# Patient Record
Sex: Female | Born: 1990 | Race: White | Hispanic: No | Marital: Single | Smoking: Never smoker
Health system: Southern US, Community
[De-identification: ages and names within clinical notes are randomized; demographics above are authoritative.]

---
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
## 2010-12-06 ENCOUNTER — Encounter: Payer: Self-pay | Admitting: Obstetrics and Gynecology

## 2014-02-01 ENCOUNTER — Encounter (HOSPITAL_COMMUNITY): Admission: RE | Payer: Self-pay | Source: Ambulatory Visit

## 2014-02-01 ENCOUNTER — Ambulatory Visit (HOSPITAL_COMMUNITY)
Admission: RE | Admit: 2014-02-01 | Payer: Managed Care, Other (non HMO) | Source: Ambulatory Visit | Admitting: Obstetrics and Gynecology

## 2014-02-01 SURGERY — DILATATION AND CURETTAGE /HYSTEROSCOPY
Anesthesia: Choice

## 2017-04-23 ENCOUNTER — Emergency Department (HOSPITAL_COMMUNITY)
Admission: EM | Admit: 2017-04-23 | Discharge: 2017-04-23 | Disposition: A | Discharge status: Home / Self Care | Payer: Managed Care, Other (non HMO) | Attending: Emergency Medicine | Admitting: Emergency Medicine

## 2017-04-23 DIAGNOSIS — F10120 Alcohol abuse with intoxication, uncomplicated: Secondary | ICD-10-CM | POA: Insufficient documentation

## 2017-04-23 DIAGNOSIS — F10920 Alcohol use, unspecified with intoxication, uncomplicated: Secondary | ICD-10-CM

## 2017-04-23 LAB — COMPREHENSIVE METABOLIC PANEL
ALK PHOS: 71 U/L (ref 38–126)
ALT: 17 U/L (ref 14–54)
ANION GAP: 11 (ref 5–15)
AST: 33 U/L (ref 15–41)
Albumin: 4.7 g/dL (ref 3.5–5.0)
BILIRUBIN TOTAL: 1 mg/dL (ref 0.3–1.2)
BUN: 6 mg/dL (ref 6–20)
CALCIUM: 8.9 mg/dL (ref 8.9–10.3)
CO2: 23 mmol/L (ref 22–32)
Chloride: 103 mmol/L (ref 101–111)
Creatinine, Ser: 0.69 mg/dL (ref 0.44–1.00)
GFR calc Af Amer: >60 mL/min (ref >60)
GLUCOSE: 115 mg/dL — AB (ref 65–99)
POTASSIUM: 4.6 mmol/L (ref 3.5–5.1)
Sodium: 137 mmol/L (ref 135–145)
TOTAL PROTEIN: 7.7 g/dL (ref 6.5–8.1)

## 2017-04-23 LAB — CBC WITH DIFFERENTIAL/PLATELET
Basophils Absolute: 0 10*3/uL (ref 0.0–0.1)
Basophils Relative: 0 %
Eosinophils Absolute: 0 10*3/uL (ref 0.0–0.7)
Eosinophils Relative: 0 %
HEMATOCRIT: 41 % (ref 36.0–46.0)
HEMOGLOBIN: 14.7 g/dL (ref 12.0–15.0)
LYMPHS ABS: 3.8 10*3/uL (ref 0.7–4.0)
LYMPHS PCT: 43 %
MCH: 31.6 pg (ref 26.0–34.0)
MCHC: 35.9 g/dL (ref 30.0–36.0)
MCV: 88.2 fL (ref 78.0–100.0)
MONO ABS: 0.4 10*3/uL (ref 0.1–1.0)
MONOS PCT: 5 %
NEUTROS ABS: 4.5 10*3/uL (ref 1.7–7.7)
NEUTROS PCT: 52 %
Platelets: 282 10*3/uL (ref 150–400)
RBC: 4.65 MIL/uL (ref 3.87–5.11)
RDW: 11.5 % (ref 11.5–15.5)
WBC: 8.7 10*3/uL (ref 4.0–10.5)

## 2017-04-23 LAB — ETHANOL: ALCOHOL ETHYL (B): 328 mg/dL — AB (ref <5)

## 2017-04-23 LAB — I-STAT BETA HCG BLOOD, ED (MC, WL, AP ONLY): I-stat hCG, quantitative: <5 m[IU]/mL (ref <5)

## 2017-04-23 MED ORDER — ONDANSETRON 8 MG PO TBDP: 8.0000 mg | ORAL_TABLET | Freq: Three times a day (TID) | ORAL | 0 refills | Status: AC | PRN

## 2017-04-23 NOTE — ED Notes (Signed)
Pt is too intoxicated to give information about medical/surgical hx, allergies or safety screenings at this time.

## 2017-04-23 NOTE — ED Notes (Signed)
Assisted patient getting dressed. Gave patient paper scrub top to wear home.

## 2017-04-23 NOTE — ED Provider Notes (Signed)
9:44 AM Pt is awake. Her father is here and will take her home. Vitals good.    Gwendolyn Ochoa, Gwendolyn Rowlands, MD 04/23/17 [phone]

## 2017-04-23 NOTE — ED Notes (Signed)
Father given patient belonging back and informed him that I was told by night shift that she had lot of money in her wallet.

## 2017-04-23 NOTE — ED Triage Notes (Signed)
Pt bib [facility] d/t ETOH intoxication.  Per EMS pt had been out drinking w/ friends when she fell asleep on the bar.  Pt responds to painful stimuli only.  VSS in the field.

## 2017-04-23 NOTE — ED Notes (Signed)
Date and time results received: 04/23/17 0620 (use smartphrase ".now" to insert current time)  Test: ETOH Critical Value: 328  Name of Provider Notified: Preston FleetingGlick  Orders Received? Or Actions Taken?: none

## 2017-04-23 NOTE — ED Provider Notes (Signed)
[facility]-EMERGENCY DEPT Provider Note   CSN: [id number] Arrival date & time: 04/23/17  0445     History   Chief Complaint Chief Complaint  Patient presents with  . Alcohol Intoxication    HPI Gwendolyn Ochoa is a 26 y.o. female.  The history is provided by the EMS personnel. The history is limited by the condition of the patient (Intoxicated).  She was brought in by ambulance after being found passed out at a sushi bar. She is not able to give any history.  No past medical history on file.  There are no active problems to display for this patient.   No past surgical history on file.  OB History    No data available       Home Medications    Prior to Admission medications   Not on File    Family History No family history on file.  Social History Social History  Substance Use Topics  . Smoking status: Not on file  . Smokeless tobacco: Not on file  . Alcohol use Not on file     Allergies   Patient has no allergy information on record.   Review of Systems Review of Systems  Unable to perform ROS: Mental status change     Physical Exam Updated Vital Signs BP 123/84   Pulse 72   Resp 17   SpO2 98%   Physical Exam  Nursing note and vitals reviewed.  26 year old female, resting comfortably and in no acute distress. Vital signs are normal. Oxygen saturation is 98%, which is normal. Head is normocephalic and atraumatic. PERRLA, EOMI. Oropharynx is clear. Neck is nontender and supple without adenopathy or JVD. Back is nontender and there is no CVA tenderness. Lungs are clear without rales, wheezes, or rhonchi. Chest is nontender. Heart has regular rate and rhythm without murmur. Abdomen is soft, flat, nontender without masses or hepatosplenomegaly and peristalsis is normoactive. Extremities have no cyanosis or edema, full range of motion is present. Skin is warm and dry without rash. Neurologic: She is somnolent but arousable, oriented to person but not  place or time, speech is somewhat slurred consistent with alcohol intoxication. Cranial nerves are grossly intact. She moves all extremities equally.  ED Treatments / Results  Labs (all labs ordered are listed, but only abnormal results are displayed) Labs Reviewed  COMPREHENSIVE METABOLIC PANEL - Abnormal; Notable for the following:       Result Value   Glucose, Bld 115 (*)    All other components within normal limits  ETHANOL - Abnormal; Notable for the following:    Alcohol, Ethyl (B) 328 (*)    All other components within normal limits  CBC WITH DIFFERENTIAL/PLATELET  I-STAT BETA HCG BLOOD, ED ([facility], [facility], [facility] ONLY)    Procedures Procedures (including critical care time)  Medications Ordered in ED Medications - No data to display   Initial Impression / Assessment and Plan / ED Course  I have reviewed the triage vital signs and the nursing notes.  Pertinent labs & imaging results that were available during my care of the patient were reviewed by me and considered in my medical decision making (see chart for details).  Apparent alcohol intoxication. Old records are reviewed, and she has no relevant past visits. Screening labs and alcohol level will be checked and patient will be observed.  Ethanol level is come back very high at 328. This is sufficiently high to account for the patient's presentation. She will need to be  observed in the ED until she is able to ambulate and converse.  Final Clinical Impressions(s) / ED Diagnoses   Final diagnoses:  Alcohol intoxication, uncomplicated (HCC)    New Prescriptions New Prescriptions   No medications on file     Dione Booze, MD 04/23/17 [phone]

## 2017-04-23 NOTE — ED Notes (Signed)
Patient's father called stating that he just found out patient was here in ED and he is on his way to come get her.

## 2017-04-23 NOTE — ED Notes (Signed)
Bed: RESA Expected date:  Expected time:  Means of arrival:  Comments: 26yo ETOH/UNRESPONSIVE

## 2017-04-23 NOTE — ED Notes (Signed)
Patient has one belonging bag that is locked in lower cabinet in treatment room #17.

## 2019-09-11 ENCOUNTER — Other Ambulatory Visit: Payer: Self-pay

## 2019-09-11 DIAGNOSIS — Z20822 Contact with and (suspected) exposure to covid-19: Secondary | ICD-10-CM

## 2019-09-12 LAB — NOVEL CORONAVIRUS, NAA: SARS-CoV-2, NAA: NOT DETECTED

## 2019-10-03 ENCOUNTER — Other Ambulatory Visit: Payer: Self-pay

## 2019-10-03 DIAGNOSIS — Z20822 Contact with and (suspected) exposure to covid-19: Secondary | ICD-10-CM

## 2019-10-05 LAB — NOVEL CORONAVIRUS, NAA: SARS-CoV-2, NAA: NOT DETECTED

## 2019-11-14 ENCOUNTER — Ambulatory Visit: Payer: BLUE CROSS/BLUE SHIELD | Attending: Internal Medicine

## 2019-11-14 DIAGNOSIS — Z20822 Contact with and (suspected) exposure to covid-19: Secondary | ICD-10-CM

## 2019-11-16 LAB — NOVEL CORONAVIRUS, NAA: SARS-CoV-2, NAA: NOT DETECTED

## 2019-11-22 ENCOUNTER — Ambulatory Visit: Payer: BLUE CROSS/BLUE SHIELD | Attending: Internal Medicine

## 2019-11-22 DIAGNOSIS — Z20822 Contact with and (suspected) exposure to covid-19: Secondary | ICD-10-CM

## 2019-11-24 LAB — NOVEL CORONAVIRUS, NAA: SARS-CoV-2, NAA: NOT DETECTED

## 2020-07-08 ENCOUNTER — Other Ambulatory Visit: Payer: BLUE CROSS/BLUE SHIELD

## 2020-07-08 ENCOUNTER — Other Ambulatory Visit: Payer: Self-pay

## 2020-07-08 DIAGNOSIS — Z20822 Contact with and (suspected) exposure to covid-19: Secondary | ICD-10-CM

## 2020-07-10 LAB — NOVEL CORONAVIRUS, NAA: SARS-CoV-2, NAA: NOT DETECTED

## 2020-07-10 LAB — SARS-COV-2, NAA 2 DAY TAT

## 2021-10-17 ENCOUNTER — Other Ambulatory Visit: Payer: Self-pay

## 2021-10-17 ENCOUNTER — Emergency Department (HOSPITAL_COMMUNITY): Payer: BLUE CROSS/BLUE SHIELD

## 2021-10-17 ENCOUNTER — Encounter (HOSPITAL_COMMUNITY): Payer: Self-pay

## 2021-10-17 ENCOUNTER — Emergency Department (HOSPITAL_COMMUNITY)
Admission: EM | Admit: 2021-10-17 | Discharge: 2021-10-18 | Disposition: A | Discharge status: Home / Self Care | Payer: BLUE CROSS/BLUE SHIELD | Attending: Emergency Medicine | Admitting: Emergency Medicine

## 2021-10-17 DIAGNOSIS — W540XXA Bitten by dog, initial encounter: Secondary | ICD-10-CM | POA: Insufficient documentation

## 2021-10-17 DIAGNOSIS — S31821A Laceration without foreign body of left buttock, initial encounter: Secondary | ICD-10-CM | POA: Insufficient documentation

## 2021-10-17 DIAGNOSIS — S50811A Abrasion of right forearm, initial encounter: Secondary | ICD-10-CM | POA: Diagnosis not present

## 2021-10-17 MED ORDER — AMOXICILLIN-POT CLAVULANATE 875-125 MG PO TABS: 1.0000 | ORAL_TABLET | Freq: Two times a day (BID) | ORAL | 0 refills | Status: AC

## 2021-10-17 MED ORDER — HYDROCODONE-ACETAMINOPHEN 5-325 MG PO TABS
1.0000 | ORAL_TABLET | Freq: Once | ORAL | Status: AC
  Administered 2021-10-18: 1 via ORAL
  Filled 2021-10-17: qty 1

## 2021-10-17 NOTE — ED Provider Notes (Signed)
[facility] DEPT Provider Note   CSN: [id number] Arrival date & time: 10/17/21  2239     History Chief Complaint  Patient presents with   Animal Bite   Extremity Laceration    Gwendolyn Ochoa is a 30 y.o. female.   Animal Bite Associated symptoms: no fever and no rash    30 year old female presenting to the emergency department with multiple wounds from a dog bite.  The patient states that she was trying to separate her dog from her boyfriend's dog.  She states that her tetanus is up-to-date as of the past 2 years.  She states that the dog's shots are also up-to-date.  She sustained a small 0.5 cm laceration to the left buttocks as well as abrasions to the right forearm.  She endorses pain that is sharp, shooting associated swelling in her right wrist and forearm, worse with movement, better with rest.  History reviewed. No pertinent past medical history.  There are no problems to display for this patient.   History reviewed. No pertinent surgical history.   OB History   No obstetric history on file.     History reviewed. No pertinent family history.  Social History   Tobacco Use   Smoking status: Never   Smokeless tobacco: Never  Substance Use Topics   Alcohol use: Yes    Comment: occasionally   Drug use: Never    Home Medications Prior to Admission medications   Medication Sig Start Date End Date Taking? Authorizing Provider  amoxicillin-clavulanate (AUGMENTIN) 875-125 MG tablet Take 1 tablet by mouth every 12 (twelve) hours. 10/17/21  Yes Regan Lemming, MD  HYDROcodone-acetaminophen (NORCO/VICODIN) 5-325 MG tablet Take 2 tablets by mouth every 4 (four) hours as needed. 10/18/21  Yes Regan Lemming, MD  ondansetron (ZOFRAN ODT) 8 MG disintegrating tablet Take 1 tablet (8 mg total) by mouth every 8 (eight) hours as needed for nausea or vomiting. 04/23/17   Jola Schmidt, MD    Allergies    Patient has no known allergies.  Review of  Systems   Review of Systems  Constitutional:  Negative for chills and fever.  HENT:  Negative for ear pain and sore throat.   Eyes:  Negative for pain and visual disturbance.  Respiratory:  Negative for cough and shortness of breath.   Cardiovascular:  Negative for chest pain and palpitations.  Gastrointestinal:  Negative for abdominal pain and vomiting.  Genitourinary:  Negative for dysuria and hematuria.  Musculoskeletal:  Positive for joint swelling. Negative for arthralgias and back pain.  Skin:  Positive for wound. Negative for color change and rash.  Neurological:  Negative for seizures and syncope.  All other systems reviewed and are negative.  Physical Exam Updated Vital Signs BP (!) 143/114 (BP Location: Left Arm)   Pulse (!) 135   Temp 98.2 F (36.8 C) (Oral)   Resp 18   Ht 5\' 8"  (1.727 m)   Wt 63.5 kg   SpO2 98%   BMI 21.29 kg/m   Physical Exam Vitals and nursing note reviewed.  Constitutional:      General: She is not in acute distress.    Appearance: She is well-developed.  HENT:     Head: Normocephalic and atraumatic.  Eyes:     Conjunctiva/sclera: Conjunctivae normal.     Pupils: Pupils are equal, round, and reactive to light.  Cardiovascular:     Rate and Rhythm: Normal rate and regular rhythm.     Heart sounds: No murmur heard.  Comments: 2+ radial pulses on the right Pulmonary:     Effort: Pulmonary effort is normal. No respiratory distress.     Breath sounds: Normal breath sounds.  Abdominal:     General: There is no distension.     Palpations: Abdomen is soft.     Tenderness: There is no abdominal tenderness. There is no guarding.  Genitourinary:    Comments: 0.5 cm laceration to the posterior left buttocks Musculoskeletal:        General: No swelling, deformity or signs of injury.     Cervical back: Neck supple.     Comments: Swelling, tenderness, abrasions to the right wrist  Skin:    General: Skin is warm and dry.     Capillary Refill:  Capillary refill takes less than 2 seconds.     Findings: No lesion or rash.  Neurological:     General: No focal deficit present.     Mental Status: She is alert. Mental status is at baseline.     Comments: Intact motor function along the median, ulnar, radial nerve distributions of the right hand, intact sensation to light touch.  Psychiatric:        Mood and Affect: Mood normal.    ED Results / Procedures / Treatments   Labs (all labs ordered are listed, but only abnormal results are displayed) Labs Reviewed - No data to display  EKG None  Radiology DG Forearm Right  Result Date: 10/18/2021 CLINICAL DATA:  Attacked by dog today with forearm and hand lacerations and swelling. EXAM: RIGHT HAND - COMPLETE 3+ VIEW; RIGHT FOREARM - 2 VIEW COMPARISON:  None. FINDINGS: There is no evidence of fracture or dislocation. There is no evidence of arthropathy or other focal bone abnormality. There is mild-to-moderate dorsal swelling at the wrist and hand. No radiopaque foreign body is seen. There is no visible soft tissue gas. IMPRESSION: Dorsal soft tissue swelling in the wrist and hand, without evidence of fractures or underlying radiopaque foreign body. Electronically Signed   By: Almira Bar M.D.   On: 10/18/2021 00:11   DG Hand Complete Right  Result Date: 10/18/2021 CLINICAL DATA:  Attacked by dog today with forearm and hand lacerations and swelling. EXAM: RIGHT HAND - COMPLETE 3+ VIEW; RIGHT FOREARM - 2 VIEW COMPARISON:  None. FINDINGS: There is no evidence of fracture or dislocation. There is no evidence of arthropathy or other focal bone abnormality. There is mild-to-moderate dorsal swelling at the wrist and hand. No radiopaque foreign body is seen. There is no visible soft tissue gas. IMPRESSION: Dorsal soft tissue swelling in the wrist and hand, without evidence of fractures or underlying radiopaque foreign body. Electronically Signed   By: Almira Bar M.D.   On: 10/18/2021 00:11     Procedures Procedures   Medications Ordered in ED Medications  HYDROcodone-acetaminophen (NORCO/VICODIN) 5-325 MG per tablet 1 tablet (1 tablet Oral Given 10/18/21 0010)    ED Course  I have reviewed the triage vital signs and the nursing notes.  Pertinent labs & imaging results that were available during my care of the patient were reviewed by me and considered in my medical decision making (see chart for details).    MDM Rules/Calculators/A&P                           30 year old female presenting to the emergency department with multiple wounds from a dog bite.  The patient states that she was trying to  separate her dog from her boyfriend's dog.  She states that her tetanus is up-to-date as of the past 2 years.  She states that the dog's shots are also up-to-date.  She sustained a small 0.5 cm laceration to the left buttocks as well as abrasions to the right forearm.  She endorses pain that is sharp, shooting associated swelling in her right wrist and forearm, worse with movement, better with rest.  The patient's tetanus is up-to-date.  Her buttock wound was left open due to risk for infection and dressed by nursing staff bedside.  X-ray imaging of her right hand and wrist was without evidence of fracture or dislocation, mild to moderate dorsal soft tissue swelling of the wrist and hand, no radiopaque foreign body noted.  The dog is vaccinated with no need for rabies prophylaxis.  Patient provided with pain medication and a prescription for Augmentin.   Final Clinical Impression(s) / ED Diagnoses Final diagnoses:  Dog bite, initial encounter    Rx / DC Orders ED Discharge Orders          Ordered    HYDROcodone-acetaminophen (NORCO/VICODIN) 5-325 MG tablet  Every 4 hours PRN        10/18/21 0034    amoxicillin-clavulanate (AUGMENTIN) 875-125 MG tablet  Every 12 hours        10/17/21 2358             Regan Lemming, MD 10/18/21 1251

## 2021-10-17 NOTE — ED Triage Notes (Signed)
Patient got bit by her dog while separating it from her boyfriends dog. She said shots are up to date. It was a pit bull and great dane. Laceration to left buttocks and right forearm and left inner thumb. Swelling to the right forearm. Tetanus shot is updated per patient.

## 2021-10-18 ENCOUNTER — Encounter (HOSPITAL_COMMUNITY): Payer: Self-pay | Admitting: Radiology

## 2021-10-18 MED ORDER — HYDROCODONE-ACETAMINOPHEN 5-325 MG PO TABS: 2.0000 | ORAL_TABLET | ORAL | 0 refills | Status: AC | PRN

## 2021-10-18 NOTE — Discharge Instructions (Signed)
You were evaluated in the Emergency Department and after careful evaluation, we did not find any emergent condition requiring admission or further testing in the hospital.  Your exam/testing today was overall reassuring.  Do not close dog bites and let them heal from the inside out as closing a dog bite can increase significantly risk of developing infection.  We will dress the wound here and prescribe Augmentin for antibiotic coverage.  Your x-rays of your hand and forearm are negative for acute fracture, no evidence of foreign body.  Your soft tissue swelling and pain is likely from the bite.  I prescribed pain medicine in addition to your antibiotic.  Please return to the Emergency Department if you experience any worsening of your condition.  Thank you for allowing [place] to be a part of your care.

## 2022-12-17 IMAGING — DX DG HAND COMPLETE 3+V*R*
3 series · 3 of 3 positions shown · non-contrast
Comparison: None.

CLINICAL DATA: Attacked by dog today with forearm and hand
lacerations and swelling.

EXAM:
RIGHT HAND - COMPLETE 3+ VIEW; RIGHT FOREARM - 2 VIEW

[hand ap]
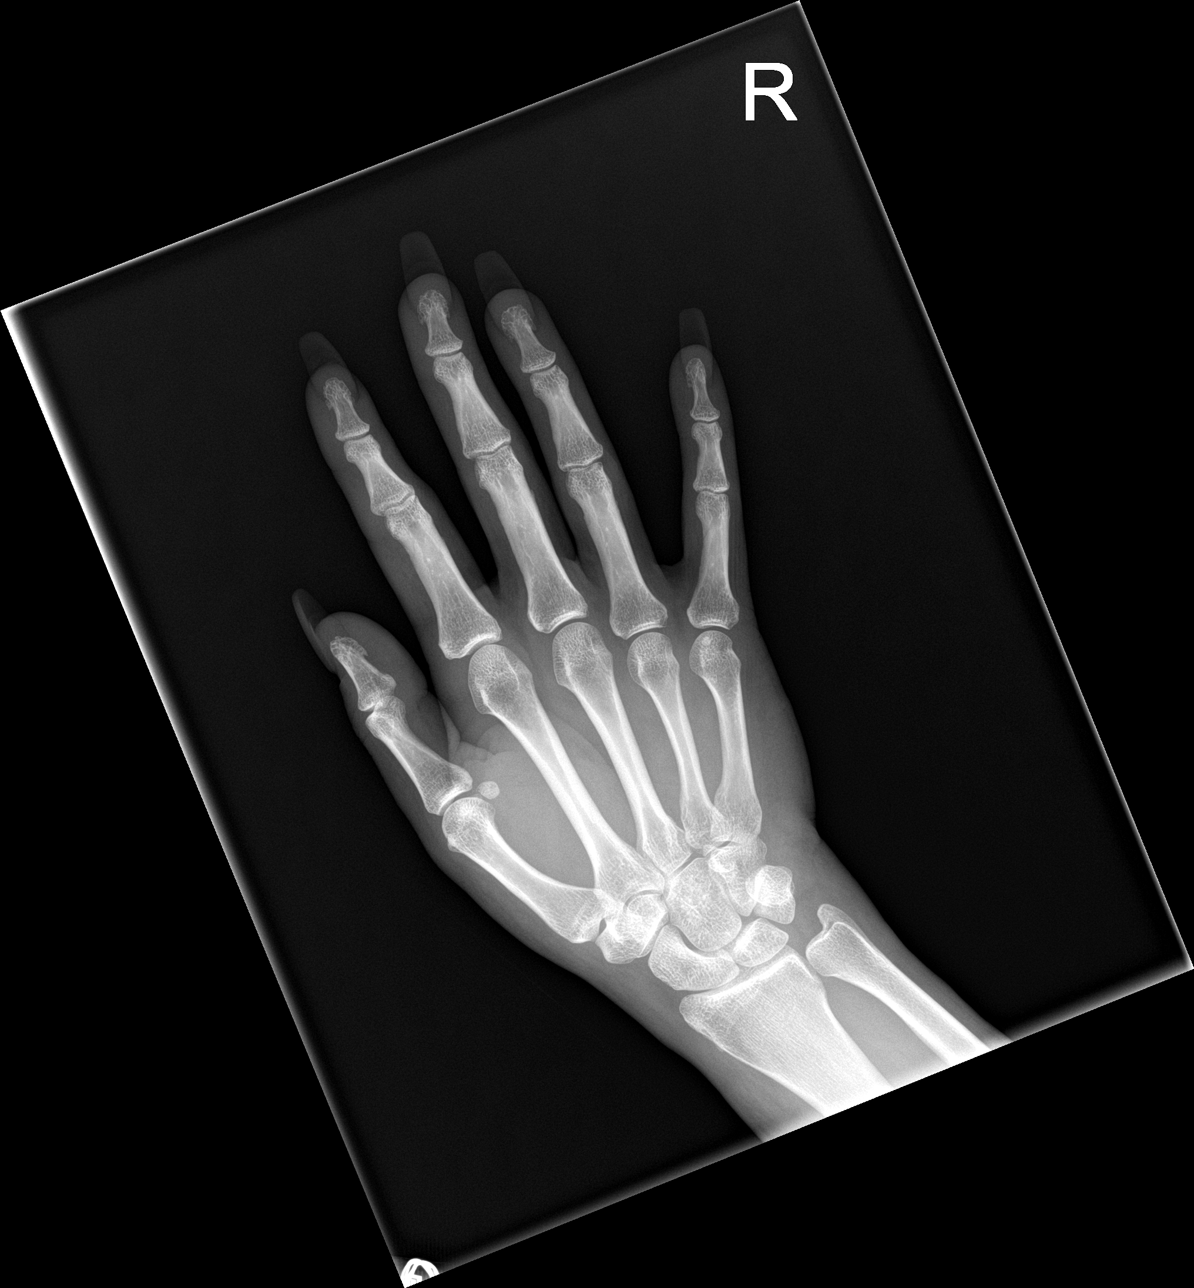

[hand obl]
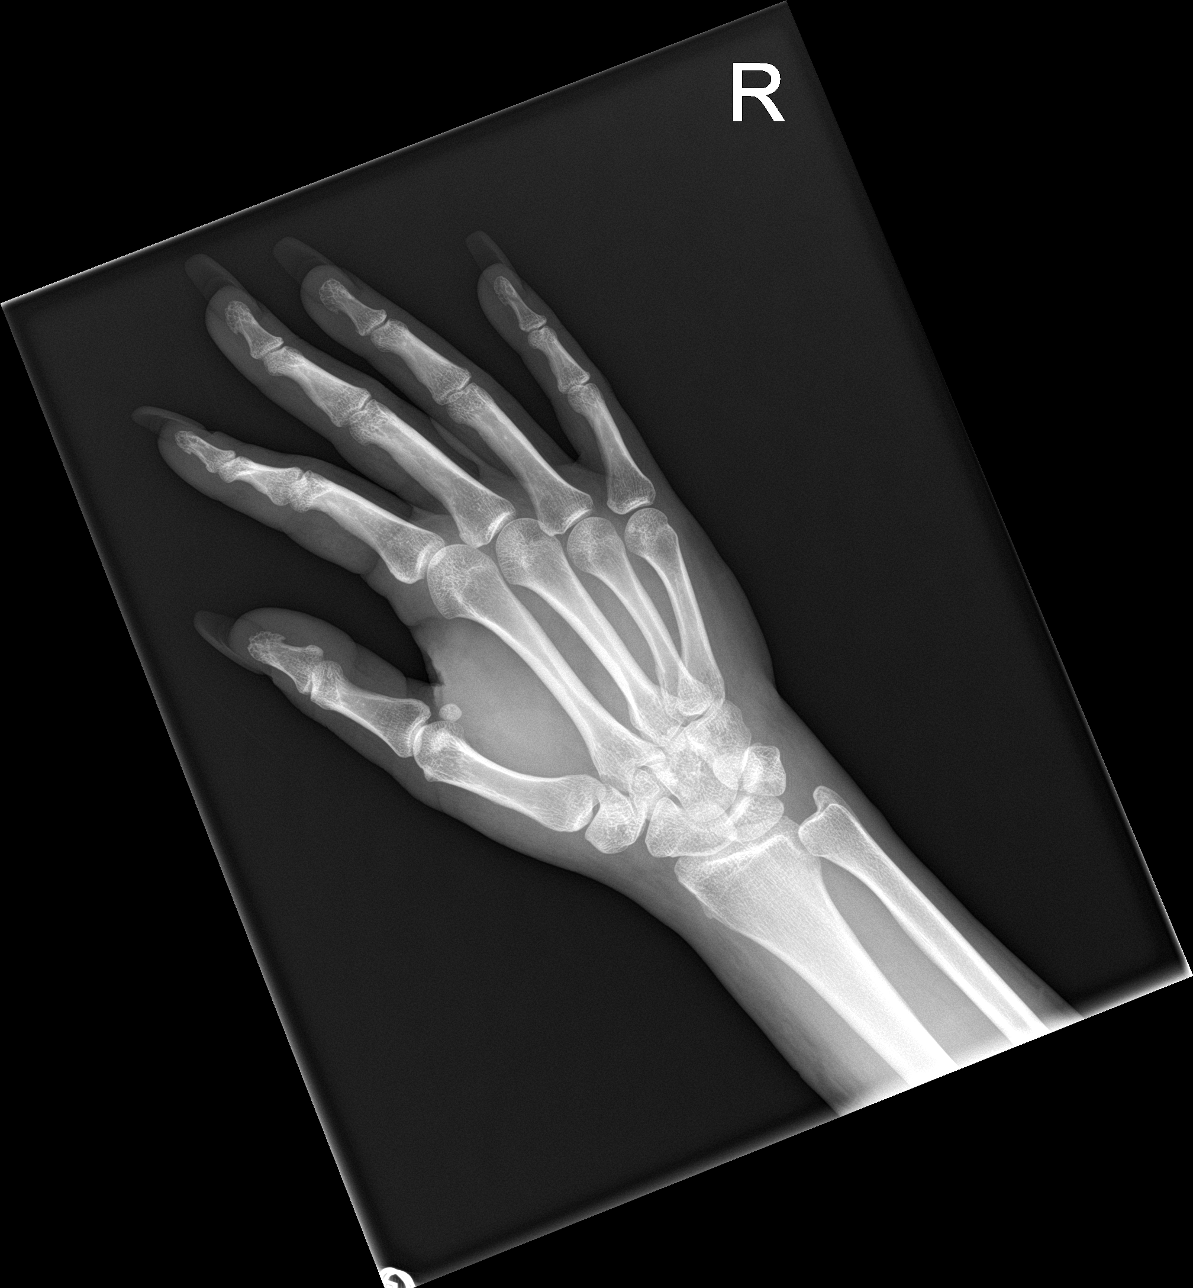

[hand lat]
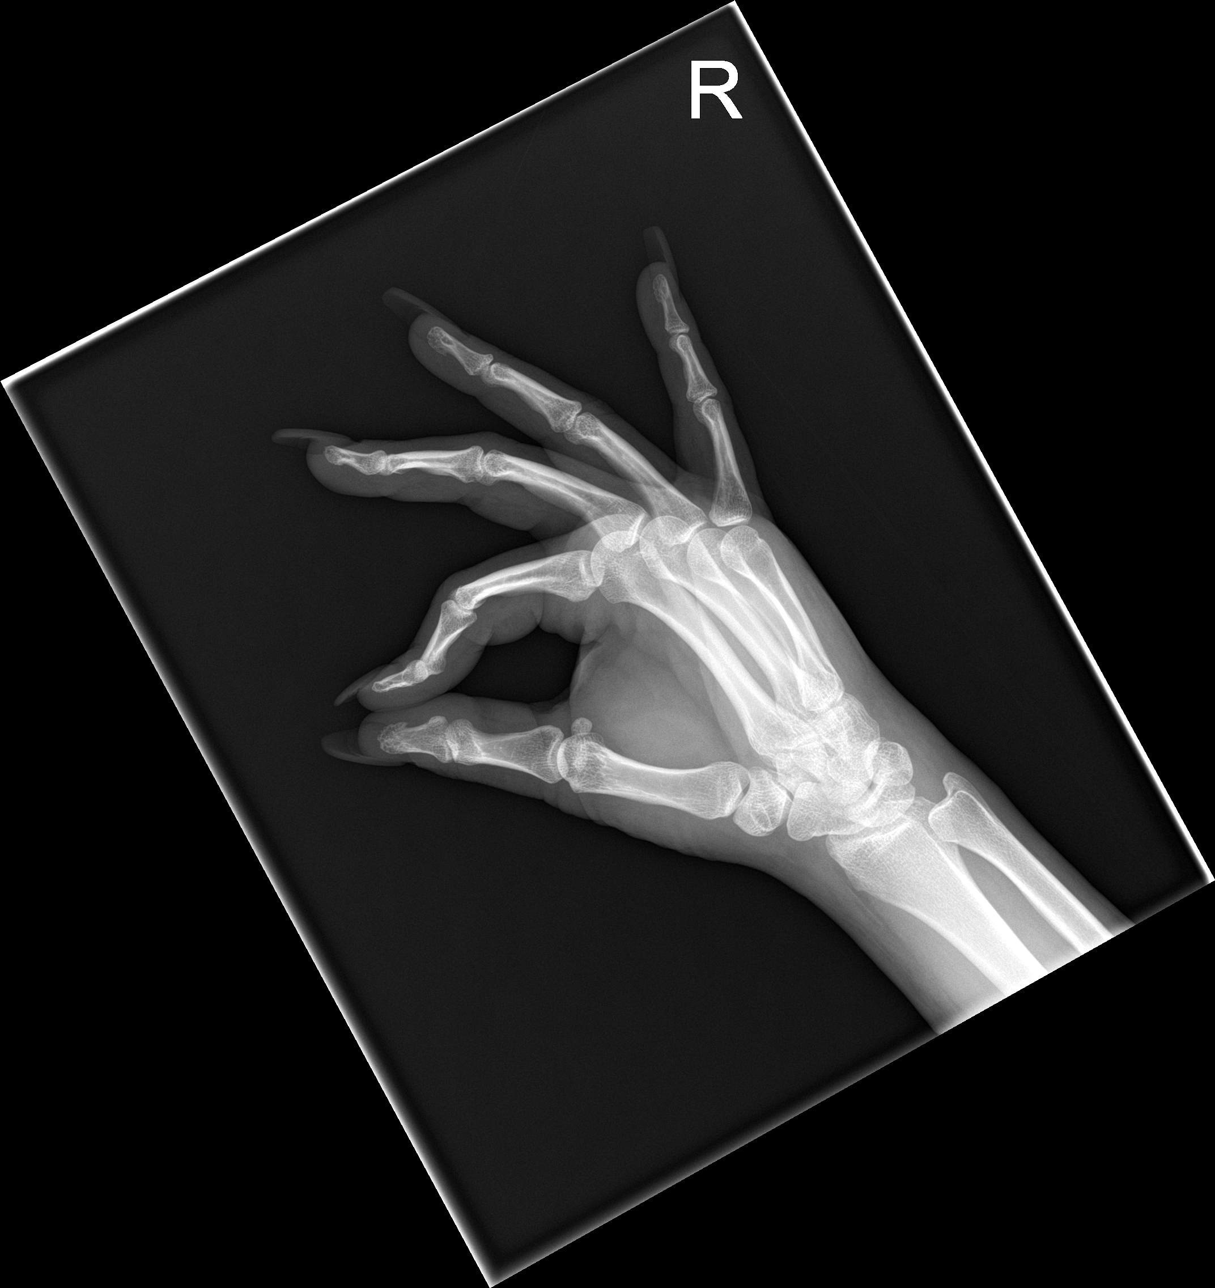

[3 of 3 positions shown; findings below may reference images not displayed]

FINDINGS: There is no evidence of fracture or dislocation. There is no
evidence of arthropathy or other focal bone abnormality. There is
mild-to-moderate dorsal swelling at the wrist and hand. No
radiopaque foreign body is seen. There is no visible soft tissue
gas.
IMPRESSION: Dorsal soft tissue swelling in the wrist and hand, without evidence
of fractures or underlying radiopaque foreign body.

## 2022-12-17 IMAGING — DX DG FOREARM 2V*R*
2 series · 2 of 2 positions shown · non-contrast
Comparison: None.

CLINICAL DATA: Attacked by dog today with forearm and hand
lacerations and swelling.

EXAM:
RIGHT HAND - COMPLETE 3+ VIEW; RIGHT FOREARM - 2 VIEW

[forearm ap]
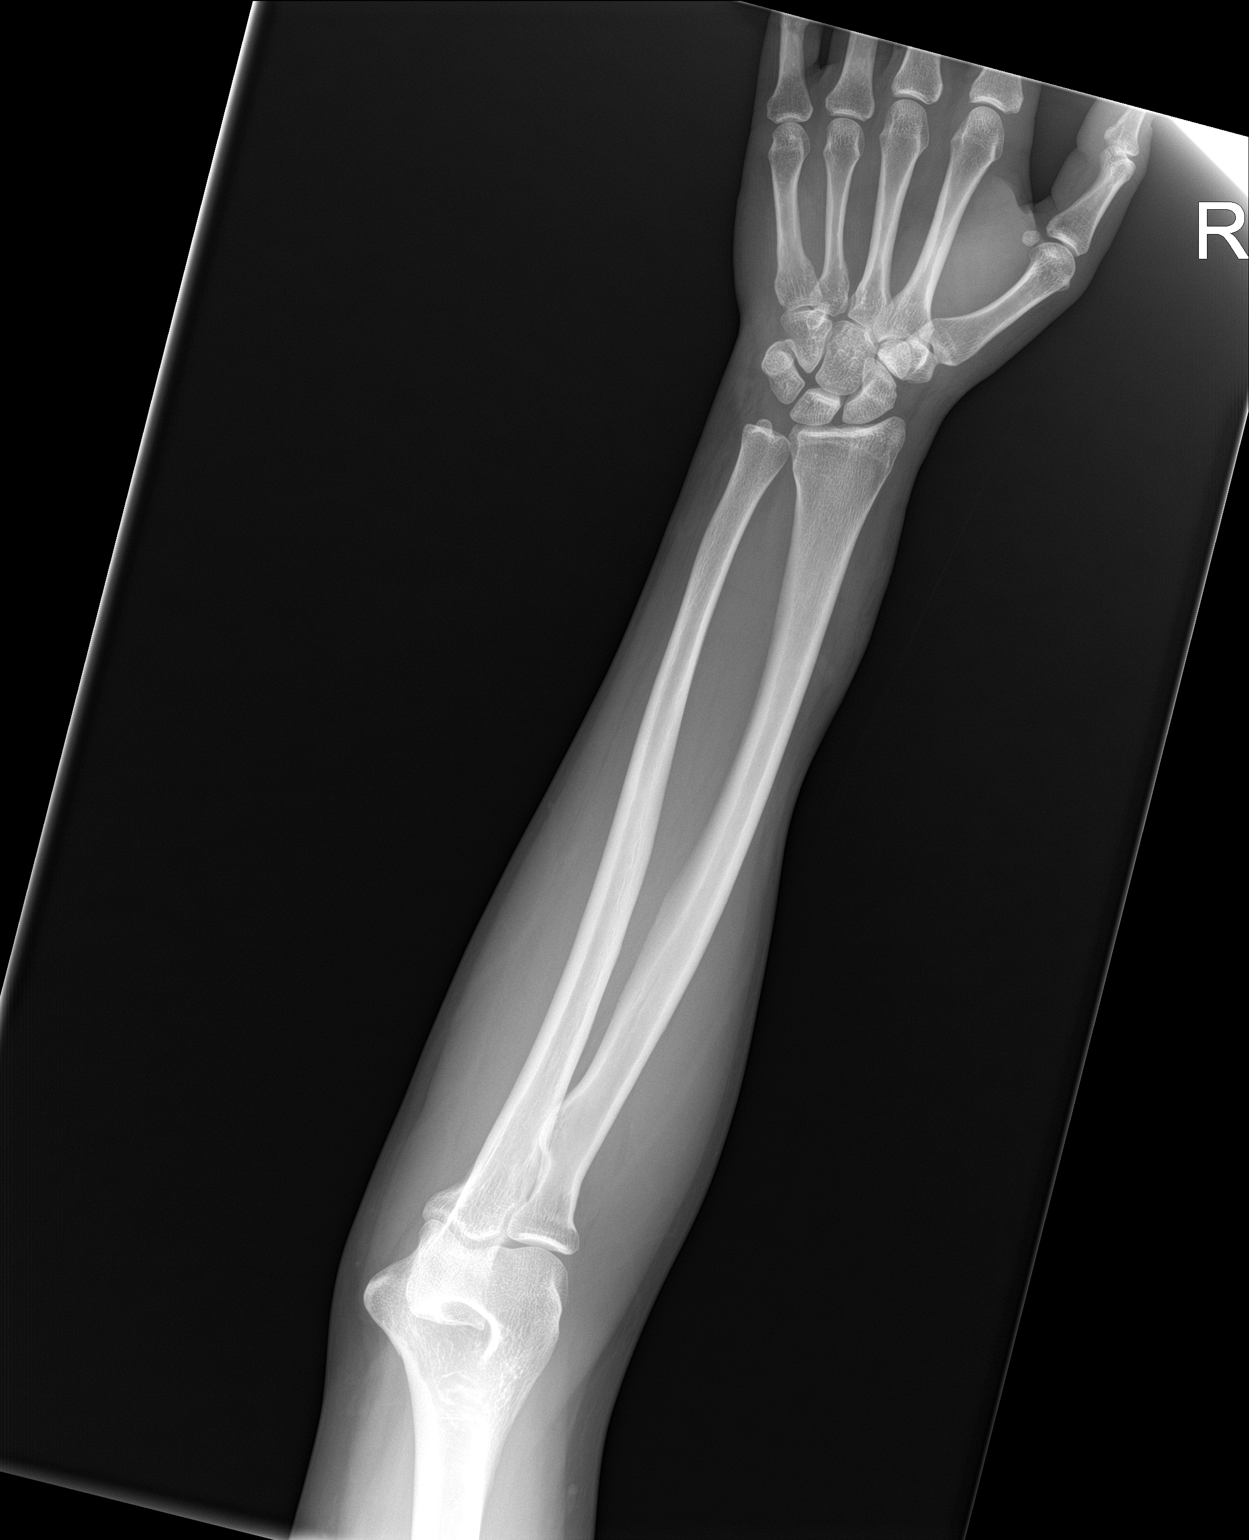

[forearm lat]
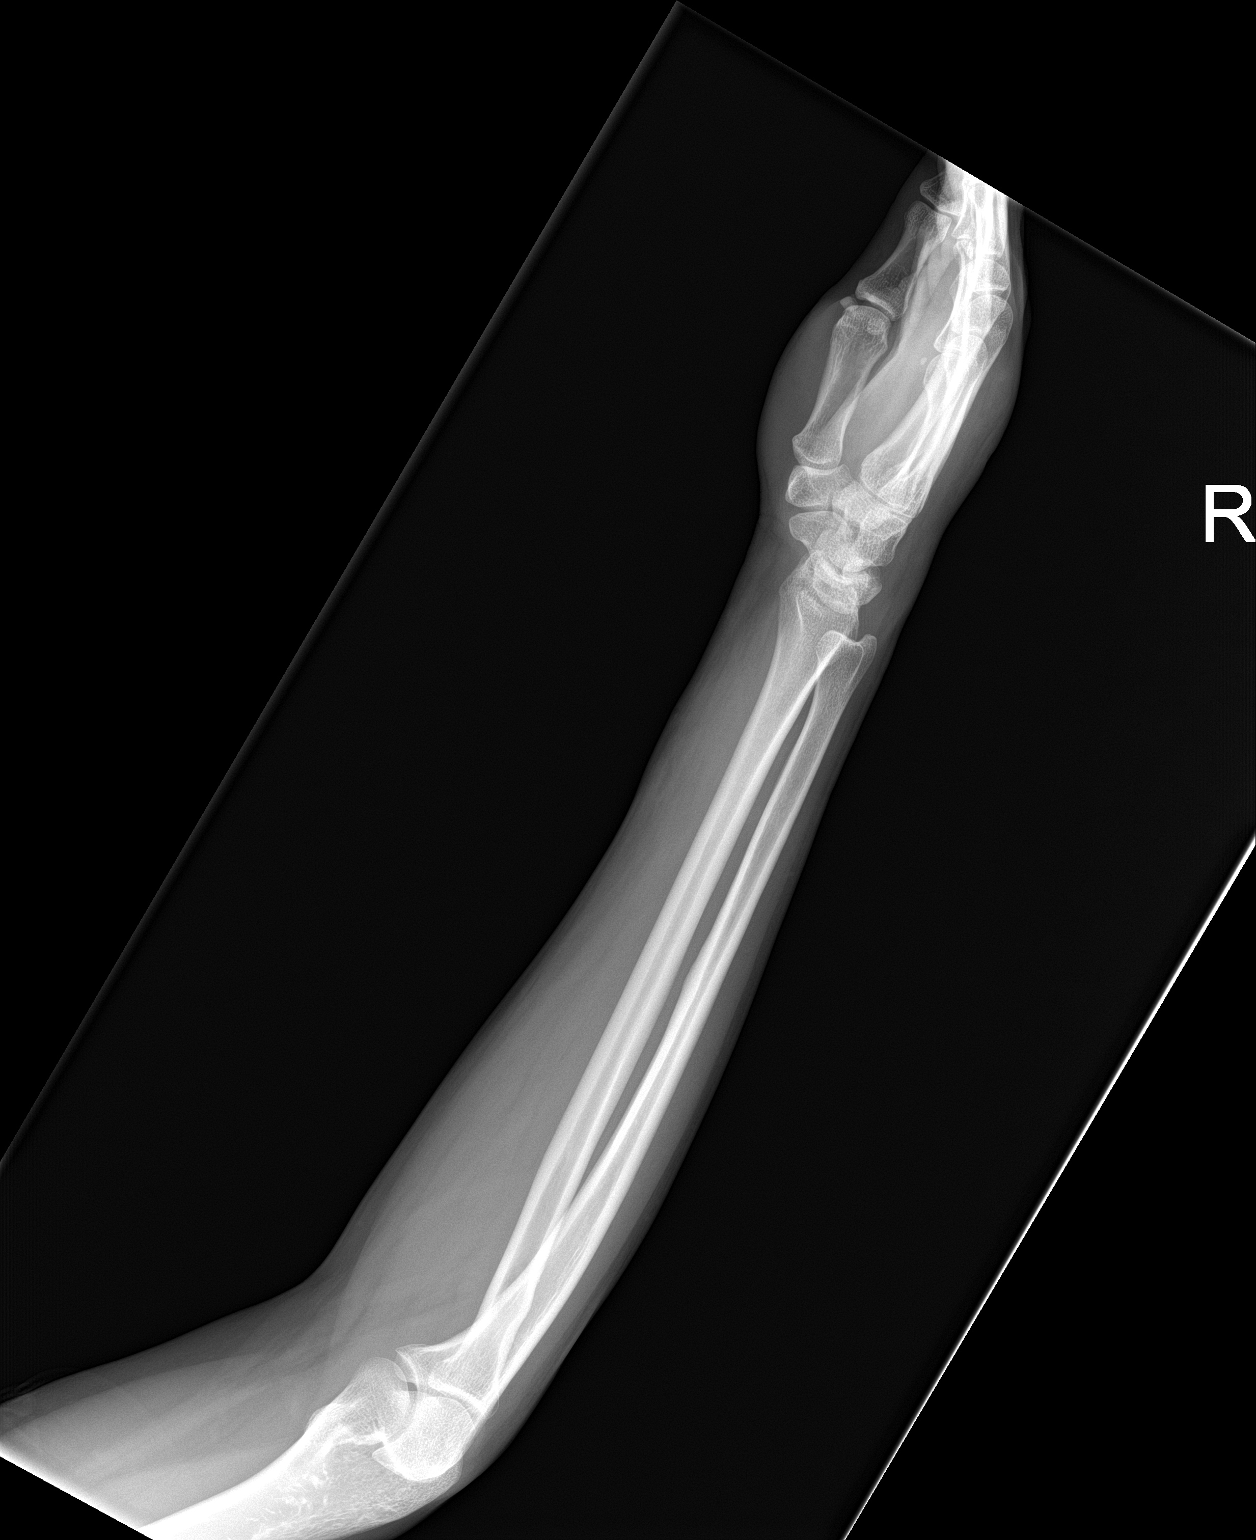

[2 of 2 positions shown; findings below may reference images not displayed]

FINDINGS: There is no evidence of fracture or dislocation. There is no
evidence of arthropathy or other focal bone abnormality. There is
mild-to-moderate dorsal swelling at the wrist and hand. No
radiopaque foreign body is seen. There is no visible soft tissue
gas.
IMPRESSION: Dorsal soft tissue swelling in the wrist and hand, without evidence
of fractures or underlying radiopaque foreign body.
